# Patient Record
Sex: Female | Born: 2005
Health system: Southern US, Community
[De-identification: ages and names within clinical notes are randomized; demographics above are authoritative.]

## PROBLEM LIST (undated history)

## (undated) DIAGNOSIS — Q969 Turner's syndrome, unspecified: Secondary | ICD-10-CM

## (undated) DIAGNOSIS — F319 Bipolar disorder, unspecified: Secondary | ICD-10-CM

## (undated) DIAGNOSIS — F840 Autistic disorder: Secondary | ICD-10-CM

---
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
## 2024-01-03 ENCOUNTER — Telehealth: Payer: Self-pay | Admitting: Psychiatry

## 2024-01-03 NOTE — Telephone Encounter (Signed)
 Pt needs of Vyvanse  and Adderall    [facility] [address]

## 2024-01-04 ENCOUNTER — Other Ambulatory Visit: Payer: Self-pay

## 2024-01-04 DIAGNOSIS — F902 Attention-deficit hyperactivity disorder, combined type: Secondary | ICD-10-CM

## 2024-01-04 MED ORDER — LISDEXAMFETAMINE DIMESYLATE 70 MG PO CAPS: 70.0000 mg | ORAL_CAPSULE | Freq: Every day | ORAL | 0 refills | Status: DC

## 2024-01-04 MED ORDER — AMPHETAMINE-DEXTROAMPHETAMINE 10 MG PO TABS: 10.0000 mg | ORAL_TABLET | Freq: Two times a day (BID) | ORAL | 0 refills | Status: DC

## 2024-01-04 MED ORDER — LISDEXAMFETAMINE DIMESYLATE 70 MG PO CAPS: 70.0000 mg | ORAL_CAPSULE | Freq: Every day | ORAL | 0 refills | Status: AC

## 2024-01-04 MED ORDER — AMPHETAMINE-DEXTROAMPHETAMINE 10 MG PO TABS: 10.0000 mg | ORAL_TABLET | Freq: Two times a day (BID) | ORAL | 0 refills | Status: AC

## 2024-01-04 NOTE — Telephone Encounter (Signed)
 Pended 2 RF for both Adderall and Vyvanse .

## 2024-01-11 ENCOUNTER — Telehealth: Payer: Self-pay | Admitting: Psychiatry

## 2024-01-11 NOTE — Telephone Encounter (Deleted)
 Addendum to attached message. Mercy Macadam, Wanda Case's mom called to consider recommending a change in the [facility] to the [employer] as she has been expelled and would be better in group settings during the day due to being expelled.    [name]'s phone number is [phone].

## 2024-01-11 NOTE — Telephone Encounter (Signed)
 Addendum to attached message. Mercy Macadam, Annslee's mom called to consider recommending a change in the [facility] [employer] [facility] to the [employer] as she has been expelled and would be better in group settings during the day due to being expelled.    [name]'s phone number is [phone].

## 2024-01-11 NOTE — Telephone Encounter (Signed)
 Please see message from mom. She said patient has been expelled from her day program because of her behavior. Mom said they are recommending a behavioral day program but the only options they have are if they have a [employer] thru Medicaid. Mom was told that it would carry more weight if you could complete.  [email or web address]  I started the form, but it is 14 pages and I am not able to answer a lot of the questions.

## 2024-01-11 NOTE — Telephone Encounter (Signed)
Next visit =

## 2024-02-08 ENCOUNTER — Encounter: Payer: Self-pay | Admitting: Psychiatry

## 2024-02-08 ENCOUNTER — Ambulatory Visit: Admitting: Psychiatry

## 2024-02-08 DIAGNOSIS — F840 Autistic disorder: Secondary | ICD-10-CM

## 2024-02-08 DIAGNOSIS — F422 Mixed obsessional thoughts and acts: Secondary | ICD-10-CM | POA: Diagnosis not present

## 2024-02-08 DIAGNOSIS — F942 Disinhibited attachment disorder of childhood: Secondary | ICD-10-CM | POA: Diagnosis not present

## 2024-02-08 DIAGNOSIS — F902 Attention-deficit hyperactivity disorder, combined type: Secondary | ICD-10-CM | POA: Diagnosis not present

## 2024-02-08 MED ORDER — AMPHETAMINE-DEXTROAMPHETAMINE 10 MG PO TABS: 10.0000 mg | ORAL_TABLET | Freq: Two times a day (BID) | ORAL | 0 refills | Status: AC

## 2024-02-08 MED ORDER — BREXPIPRAZOLE 2 MG PO TABS: 2.0000 mg | ORAL_TABLET | Freq: Every day | ORAL | 1 refills | Status: AC

## 2024-02-08 MED ORDER — LAMOTRIGINE 100 MG PO TABS: 300.0000 mg | ORAL_TABLET | Freq: Every day | ORAL | 1 refills | Status: AC

## 2024-02-08 MED ORDER — CLOMIPRAMINE HCL 50 MG PO CAPS: ORAL_CAPSULE | ORAL | 1 refills | Status: AC

## 2024-02-08 MED ORDER — LISDEXAMFETAMINE DIMESYLATE 70 MG PO CAPS: 70.0000 mg | ORAL_CAPSULE | Freq: Every day | ORAL | 0 refills | Status: AC

## 2024-02-08 NOTE — Progress Notes (Signed)
 "  [facility] [address], [place]   Follow-up visit  Date of Service: 02/08/2024  CC/Purpose: Routine medication management follow up.    Wanda Case is a 19 y.o. female with a past psychiatric history of ADHD, Disinhibited social engagement, OCD, ASD who presents today for a psychiatric follow up appointment. Patient is in the custody of parents - adopted.    The patient was last seen on 11/15/23 at which time the following plan was established: Medication management:             - Continue clomipramine  150mg  nightly             - Continue Intuniv  3mg  nightly             - Lamictal  300daily for mood             - Vyvanse  70mg  daily             - Start Adderall Ir 10mg  BID             - Increase Rexulti  to 2mg  daily             - Klonopin  0.5mg  prn prior to big events or meetings _________________________________________________________________ Acute events/encounters since last visit: none    Wanda Case presents to clinic with her mother. They report that Wanda Case was expelled from [employer] due to conflict with a runner, broadcasting/film/video. She is still doing her CREW program with [employer]. They continue to notice a lot of impulsivity with Wanda Case. This is present even on her medicines. They deny any concern about her mood or anxiety at this time. She talks excessively still, which is a longstanding issue. She has lost some weight since moving to [place] .. No SI/HI/AVH.  Sleep: stable Appetite: Stable Depression: denies Bipolar symptoms:  denies Current suicidal/homicidal ideations:  denied Current auditory/visual hallucinations:  denied     Suicide Attempt/Self-Harm History: denies  Psychotherapy: Ms Wanda Case weekly. Doing DBT groups with [facility]   Previous psychiatric medication trials:  Many: Ritalin , Adderall, Focalin , guanfacine , Qelbree. unclear benefit. Atomoxetine, several SGA's      School Name: expelled from [employer]. 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  General Appearance: Neat and Well Groomed  Eye Contact:  Good  Speech:  Clear and Coherent and Normal Rate  Mood:  good  Affect:  congruent  Thought Process:  Coherent and Goal Directed  Orientation:  Full (Time, Place, and Person)  Thought Content:  Logical  Suicidal Thoughts:  No  Homicidal Thoughts:  No  Memory:  Immediate;   Fair  Judgement:  limited  Insight:  limited  Psychomotor Activity:  NA  Concentration:  Concentration:  Fair  Recall:  Wanda Case of Knowledge:  Fair  Language:  Fair  Assets:  Communication Skills Desire for Art Gallery Manager Housing Leisure Time Physical Health Resilience Social Support Talents/Skills Transportation Vocational/Educational  Cognition:  WNL      Assessment   Psychiatric Diagnoses:   ICD-10-CM   1. Autism spectrum disorder  F84.0     2. Attention deficit hyperactivity disorder (ADHD), combined type, moderate  F90.2 lisdexamfetamine  (VYVANSE ) 70 MG capsule    amphetamine -dextroamphetamine  (ADDERALL) 10 MG tablet    3. Mixed obsessional thoughts  and acts  F42.2     4. Disinhibited social engagement disorder  F94.2       Complexity: Moderate  Patient Education and Counseling:  Supportive therapy provided for identified psychosocial stressors.  Medication education provided and decisions regarding medication regimen discussed with patient/guardian.   On assessment today, Wanda Case has continued to struggle with impulsivity, which has not responded to multiple medicines. Her mood appears fairly stable as does her OCD> we will not make changes today as I do not feel medicines will make any impact on her impulsivity.. No SI/Hi/AVH.   Plan  Medication management:             - Continue clomipramine  150mg  nightly             - Continue Intuniv  3mg  nightly  - Lamictal  300daily for mood  - Vyvanse  70mg  daily  - Adderall Ir 10mg  BID  - Rexulti  2mg  daily  - Klonopin  0.5mg  prn prior to big events or meetings  Labs/Studies:  - none today  Additional recommendations:  - Continue with current therapist, Crisis plan reviewed and patient verbally contracts for safety. Go to ED with emergent symptoms or safety concerns, and Risks, benefits, side effects of medications, including any / all black box warnings, discussed with patient, who verbalizes their understanding  -Parents have been granted guardianship  - DBT groups with [facility]   Follow Up: Return in 12 weeks - Call in the interim for any side-effects, decompensation, questions, or problems between now and the next visit.   I have spent 25 minutes reviewing the patients chart, meeting with the patient and family, and reviewing medicines and side effects.   Wanda GORMAN Lauth, MD [facility]    "

## 2024-02-09 ENCOUNTER — Telehealth: Payer: Self-pay | Admitting: Psychiatry

## 2024-02-09 NOTE — Telephone Encounter (Signed)
 Mom called requesting Rx Vyvanse  70 mg to [facility]. Out Saturday.

## 2024-02-09 NOTE — Telephone Encounter (Signed)
 Sent MyChart message that RF were available for both Adderall and Vyvanse . Issues with phone service and unable to get thru on phone.

## 2024-02-09 NOTE — Telephone Encounter (Signed)
 Tried calling both numbers, home# no answer, cell number says call can not go through, will try again. Wanted to let them know RX is already at pharmacy.

## 2024-02-14 NOTE — Telephone Encounter (Signed)
 Completed and placed in the box - I haven't done this paperwork before so I'm not sure if I did it correctly

## 2024-03-29 ENCOUNTER — Ambulatory Visit: Admitting: Psychiatry

## 2024-05-08 ENCOUNTER — Ambulatory Visit: Admitting: Adult Health

## 2024-05-08 ENCOUNTER — Ambulatory Visit: Admitting: Psychiatry
